# Patient Record
Sex: Male | Born: 2000 | Race: Black or African American | Hispanic: No | Marital: Single | Smoking: Current every day smoker
Health system: Southern US, Community
[De-identification: ages and names within clinical notes are randomized; demographics above are authoritative.]

## PROBLEM LIST (undated history)

## (undated) DIAGNOSIS — R569 Unspecified convulsions: Secondary | ICD-10-CM

---
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
## 2020-01-23 ENCOUNTER — Emergency Department (HOSPITAL_COMMUNITY)
Admission: EM | Admit: 2020-01-23 | Discharge: 2020-01-23 | Disposition: A | Discharge status: Home / Self Care | Payer: Medicaid Other | Attending: Emergency Medicine | Admitting: Emergency Medicine

## 2020-01-23 ENCOUNTER — Emergency Department (HOSPITAL_COMMUNITY): Payer: Medicaid Other

## 2020-01-23 ENCOUNTER — Other Ambulatory Visit: Payer: Self-pay

## 2020-01-23 DIAGNOSIS — R569 Unspecified convulsions: Secondary | ICD-10-CM | POA: Diagnosis present

## 2020-01-23 DIAGNOSIS — Z7722 Contact with and (suspected) exposure to environmental tobacco smoke (acute) (chronic): Secondary | ICD-10-CM | POA: Diagnosis not present

## 2020-01-23 LAB — COMPREHENSIVE METABOLIC PANEL
ALT: 108 U/L — ABNORMAL HIGH (ref 0–44)
AST: 79 U/L — ABNORMAL HIGH (ref 15–41)
Albumin: 4.3 g/dL (ref 3.5–5.0)
Alkaline Phosphatase: 111 U/L (ref 38–126)
Anion gap: 20 — ABNORMAL HIGH (ref 5–15)
BUN: 9 mg/dL (ref 6–20)
CO2: 16 mmol/L — ABNORMAL LOW (ref 22–32)
Calcium: 9.2 mg/dL (ref 8.9–10.3)
Chloride: 105 mmol/L (ref 98–111)
Creatinine, Ser: 1.11 mg/dL (ref 0.61–1.24)
GFR calc Af Amer: >60 mL/min (ref >60)
GFR calc non Af Amer: >60 mL/min (ref >60)
Glucose, Bld: 128 mg/dL — ABNORMAL HIGH (ref 70–99)
Potassium: 4.3 mmol/L (ref 3.5–5.1)
Sodium: 141 mmol/L (ref 135–145)
Total Bilirubin: 0.4 mg/dL (ref 0.3–1.2)
Total Protein: 7.1 g/dL (ref 6.5–8.1)

## 2020-01-23 LAB — URINALYSIS, ROUTINE W REFLEX MICROSCOPIC
Bacteria, UA: NONE SEEN
Bilirubin Urine: NEGATIVE
Glucose, UA: NEGATIVE mg/dL
Ketones, ur: NEGATIVE mg/dL
Leukocytes,Ua: NEGATIVE
Nitrite: NEGATIVE
Protein, ur: NEGATIVE mg/dL
Specific Gravity, Urine: 1.014 (ref 1.005–1.030)
pH: 6 (ref 5.0–8.0)

## 2020-01-23 LAB — CBC WITH DIFFERENTIAL/PLATELET
Abs Immature Granulocytes: 0.02 10*3/uL (ref 0.00–0.07)
Basophils Absolute: 0.1 10*3/uL (ref 0.0–0.1)
Basophils Relative: 1 %
Eosinophils Absolute: 0.4 10*3/uL (ref 0.0–0.5)
Eosinophils Relative: 5 %
HCT: 41.6 % (ref 39.0–52.0)
Hemoglobin: 13.1 g/dL (ref 13.0–17.0)
Immature Granulocytes: 0 %
Lymphocytes Relative: 48 %
Lymphs Abs: 3.7 10*3/uL (ref 0.7–4.0)
MCH: 28.7 pg (ref 26.0–34.0)
MCHC: 31.5 g/dL (ref 30.0–36.0)
MCV: 91.2 fL (ref 80.0–100.0)
Monocytes Absolute: 0.6 10*3/uL (ref 0.1–1.0)
Monocytes Relative: 7 %
Neutro Abs: 3.1 10*3/uL (ref 1.7–7.7)
Neutrophils Relative %: 39 %
Platelets: 209 10*3/uL (ref 150–400)
RBC: 4.56 MIL/uL (ref 4.22–5.81)
RDW: 16.7 % — ABNORMAL HIGH (ref 11.5–15.5)
WBC: 7.8 10*3/uL (ref 4.0–10.5)
nRBC: 0 % (ref 0.0–0.2)

## 2020-01-23 LAB — RAPID URINE DRUG SCREEN, HOSP PERFORMED
Amphetamines: NOT DETECTED
Barbiturates: NOT DETECTED
Benzodiazepines: POSITIVE — AB
Cocaine: NOT DETECTED
Opiates: NOT DETECTED
Tetrahydrocannabinol: POSITIVE — AB

## 2020-01-23 LAB — ETHANOL: Alcohol, Ethyl (B): <10 mg/dL (ref <10)

## 2020-01-23 MED ORDER — LORAZEPAM 2 MG/ML IJ SOLN
INTRAMUSCULAR | Status: AC
  Filled 2020-01-23: qty 1

## 2020-01-23 MED ORDER — LEVETIRACETAM IN NACL 1000 MG/100ML IV SOLN
1000.0000 mg | Freq: Once | INTRAVENOUS | Status: AC
  Administered 2020-01-23: 1000 mg via INTRAVENOUS
  Filled 2020-01-23: qty 100

## 2020-01-23 MED ORDER — ACETAMINOPHEN 500 MG PO TABS
1000.0000 mg | ORAL_TABLET | Freq: Once | ORAL | Status: AC
  Administered 2020-01-23: 1000 mg via ORAL
  Filled 2020-01-23: qty 2

## 2020-01-23 MED ORDER — SODIUM CHLORIDE 0.9 % IV BOLUS
1000.0000 mL | Freq: Once | INTRAVENOUS | Status: AC
  Administered 2020-01-23: 1000 mL via INTRAVENOUS
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
## 2020-01-23 NOTE — Discharge Instructions (Signed)
Follow-up with Dr. Gerilyn Pilgrim for your seizures.  No driving.  Also you are given a follow-up for the [facility] allergy for the allergic reaction you had before

## 2020-01-25 ENCOUNTER — Encounter (HOSPITAL_COMMUNITY): Payer: Self-pay | Admitting: Emergency Medicine

## 2020-01-25 ENCOUNTER — Emergency Department (HOSPITAL_COMMUNITY)
Admission: EM | Admit: 2020-01-25 | Discharge: 2020-01-25 | Disposition: A | Discharge status: Home / Self Care | Payer: Medicaid Other | Attending: Emergency Medicine | Admitting: Emergency Medicine

## 2020-01-25 ENCOUNTER — Other Ambulatory Visit: Payer: Self-pay

## 2020-01-25 DIAGNOSIS — L509 Urticaria, unspecified: Secondary | ICD-10-CM | POA: Insufficient documentation

## 2020-01-25 DIAGNOSIS — Z5321 Procedure and treatment not carried out due to patient leaving prior to being seen by health care provider: Secondary | ICD-10-CM | POA: Diagnosis not present

## 2020-01-25 HISTORY — DX: Unspecified convulsions: R56.9

## 2020-01-25 NOTE — ED Triage Notes (Addendum)
Pt started breaking out in hives around 0015. Pt has been seen for the same and has appt with pcp tomorrow. Pt did take 2 benadryl before coming.

## 2020-03-20 ENCOUNTER — Ambulatory Visit: Payer: Medicaid Other | Admitting: Neurology

## 2020-03-20 ENCOUNTER — Encounter: Payer: Self-pay | Admitting: Neurology

## 2020-05-27 ENCOUNTER — Ambulatory Visit: Payer: Medicaid Other | Admitting: Neurology

## 2020-07-25 ENCOUNTER — Ambulatory Visit: Payer: Medicaid Other | Admitting: Neurology

## 2020-07-25 ENCOUNTER — Encounter: Payer: Self-pay | Admitting: Neurology

## 2021-01-09 ENCOUNTER — Emergency Department (HOSPITAL_COMMUNITY)
Admission: EM | Admit: 2021-01-09 | Discharge: 2021-01-09 | Disposition: A | Discharge status: Home / Self Care | Payer: Medicaid Other | Attending: Emergency Medicine | Admitting: Emergency Medicine

## 2021-01-09 ENCOUNTER — Other Ambulatory Visit: Payer: Self-pay

## 2021-01-09 ENCOUNTER — Encounter (HOSPITAL_COMMUNITY): Payer: Self-pay | Admitting: Emergency Medicine

## 2021-01-09 DIAGNOSIS — R519 Headache, unspecified: Secondary | ICD-10-CM | POA: Diagnosis not present

## 2021-01-09 DIAGNOSIS — R569 Unspecified convulsions: Secondary | ICD-10-CM | POA: Insufficient documentation

## 2021-01-09 DIAGNOSIS — M549 Dorsalgia, unspecified: Secondary | ICD-10-CM | POA: Insufficient documentation

## 2021-01-09 DIAGNOSIS — Z7722 Contact with and (suspected) exposure to environmental tobacco smoke (acute) (chronic): Secondary | ICD-10-CM | POA: Diagnosis not present

## 2021-01-09 LAB — CBC WITH DIFFERENTIAL/PLATELET
Abs Immature Granulocytes: 0.02 10*3/uL (ref 0.00–0.07)
Basophils Absolute: 0.1 10*3/uL (ref 0.0–0.1)
Basophils Relative: 1 %
Eosinophils Absolute: 0.2 10*3/uL (ref 0.0–0.5)
Eosinophils Relative: 4 %
HCT: 42.2 % (ref 39.0–52.0)
Hemoglobin: 13.7 g/dL (ref 13.0–17.0)
Immature Granulocytes: 0 %
Lymphocytes Relative: 23 %
Lymphs Abs: 1.5 10*3/uL (ref 0.7–4.0)
MCH: 28.9 pg (ref 26.0–34.0)
MCHC: 32.5 g/dL (ref 30.0–36.0)
MCV: 89 fL (ref 80.0–100.0)
Monocytes Absolute: 0.6 10*3/uL (ref 0.1–1.0)
Monocytes Relative: 9 %
Neutro Abs: 4.2 10*3/uL (ref 1.7–7.7)
Neutrophils Relative %: 63 %
Platelets: 160 10*3/uL (ref 150–400)
RBC: 4.74 MIL/uL (ref 4.22–5.81)
RDW: 16.3 % — ABNORMAL HIGH (ref 11.5–15.5)
WBC: 6.7 10*3/uL (ref 4.0–10.5)
nRBC: 0 % (ref 0.0–0.2)

## 2021-01-09 LAB — BASIC METABOLIC PANEL
Anion gap: 7 (ref 5–15)
BUN: 7 mg/dL (ref 6–20)
CO2: 26 mmol/L (ref 22–32)
Calcium: 9.1 mg/dL (ref 8.9–10.3)
Chloride: 106 mmol/L (ref 98–111)
Creatinine, Ser: 0.77 mg/dL (ref 0.61–1.24)
GFR, Estimated: >60 mL/min (ref >60)
Glucose, Bld: 92 mg/dL (ref 70–99)
Potassium: 4.3 mmol/L (ref 3.5–5.1)
Sodium: 139 mmol/L (ref 135–145)

## 2021-01-09 MED ORDER — ACETAMINOPHEN 325 MG PO TABS
650.0000 mg | ORAL_TABLET | Freq: Once | ORAL | Status: AC
  Administered 2021-01-09: 650 mg via ORAL
  Filled 2021-01-09: qty 2

## 2021-01-09 MED ORDER — LEVETIRACETAM IN NACL 1000 MG/100ML IV SOLN
1000.0000 mg | Freq: Once | INTRAVENOUS | Status: AC
  Administered 2021-01-09: 1000 mg via INTRAVENOUS
  Filled 2021-01-09: qty 100

## 2021-01-09 MED ORDER — LEVETIRACETAM 500 MG PO TABS: 500.0000 mg | ORAL_TABLET | Freq: Two times a day (BID) | ORAL | 2 refills | Status: DC
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
## 2021-01-09 NOTE — Discharge Instructions (Signed)
You were seen in the emergency department for evaluation after a probable seizure.  You had lab work that was unremarkable.  You were given an IV dose of seizure medication and a prescription for pills to start tomorrow.  You will need to see a neurologist for follow-up.  You should not drive until you are evaluated by neurology.

## 2021-01-09 NOTE — ED Triage Notes (Signed)
Patient presents with EMS from a friend's home, had a seizure in the front yard, about 1.5-2 min long, appeared to be grand mal, fell into a bush, has some abrasions and bit his tongue. Was initially postictal, but is now alert and oriented, ambulated to bed from stretcher.

## 2021-04-01 ENCOUNTER — Emergency Department (HOSPITAL_COMMUNITY): Payer: Medicaid Other

## 2021-04-01 ENCOUNTER — Emergency Department (HOSPITAL_COMMUNITY)
Admission: EM | Admit: 2021-04-01 | Discharge: 2021-04-01 | Disposition: A | Discharge status: Home / Self Care | Payer: Medicaid Other | Attending: Emergency Medicine | Admitting: Emergency Medicine

## 2021-04-01 ENCOUNTER — Other Ambulatory Visit: Payer: Self-pay

## 2021-04-01 ENCOUNTER — Encounter (HOSPITAL_COMMUNITY): Payer: Self-pay | Admitting: *Deleted

## 2021-04-01 DIAGNOSIS — R202 Paresthesia of skin: Secondary | ICD-10-CM | POA: Insufficient documentation

## 2021-04-01 DIAGNOSIS — H53149 Visual discomfort, unspecified: Secondary | ICD-10-CM | POA: Insufficient documentation

## 2021-04-01 DIAGNOSIS — R519 Headache, unspecified: Secondary | ICD-10-CM | POA: Insufficient documentation

## 2021-04-01 DIAGNOSIS — Z7722 Contact with and (suspected) exposure to environmental tobacco smoke (acute) (chronic): Secondary | ICD-10-CM | POA: Diagnosis not present

## 2021-04-01 LAB — BASIC METABOLIC PANEL
Anion gap: 6 (ref 5–15)
BUN: 13 mg/dL (ref 6–20)
CO2: 27 mmol/L (ref 22–32)
Calcium: 9.2 mg/dL (ref 8.9–10.3)
Chloride: 104 mmol/L (ref 98–111)
Creatinine, Ser: 0.94 mg/dL (ref 0.61–1.24)
GFR, Estimated: >60 mL/min (ref >60)
Glucose, Bld: 93 mg/dL (ref 70–99)
Potassium: 3.7 mmol/L (ref 3.5–5.1)
Sodium: 137 mmol/L (ref 135–145)

## 2021-04-01 LAB — CBC WITH DIFFERENTIAL/PLATELET
Abs Immature Granulocytes: 0.02 10*3/uL (ref 0.00–0.07)
Basophils Absolute: 0.1 10*3/uL (ref 0.0–0.1)
Basophils Relative: 1 %
Eosinophils Absolute: 0.6 10*3/uL — ABNORMAL HIGH (ref 0.0–0.5)
Eosinophils Relative: 10 %
HCT: 40.3 % (ref 39.0–52.0)
Hemoglobin: 13.4 g/dL (ref 13.0–17.0)
Immature Granulocytes: 0 %
Lymphocytes Relative: 48 %
Lymphs Abs: 2.8 10*3/uL (ref 0.7–4.0)
MCH: 29.3 pg (ref 26.0–34.0)
MCHC: 33.3 g/dL (ref 30.0–36.0)
MCV: 88.2 fL (ref 80.0–100.0)
Monocytes Absolute: 0.5 10*3/uL (ref 0.1–1.0)
Monocytes Relative: 9 %
Neutro Abs: 1.9 10*3/uL (ref 1.7–7.7)
Neutrophils Relative %: 32 %
Platelets: 173 10*3/uL (ref 150–400)
RBC: 4.57 MIL/uL (ref 4.22–5.81)
RDW: 15.9 % — ABNORMAL HIGH (ref 11.5–15.5)
WBC: 5.8 10*3/uL (ref 4.0–10.5)
nRBC: 0 % (ref 0.0–0.2)

## 2021-04-01 MED ORDER — DIPHENHYDRAMINE HCL 50 MG/ML IJ SOLN
12.5000 mg | Freq: Once | INTRAMUSCULAR | Status: AC
  Administered 2021-04-01: 12.5 mg via INTRAVENOUS
  Filled 2021-04-01: qty 1

## 2021-04-01 MED ORDER — METOCLOPRAMIDE HCL 5 MG/ML IJ SOLN
10.0000 mg | Freq: Once | INTRAMUSCULAR | Status: AC
  Administered 2021-04-01: 10 mg via INTRAVENOUS
  Filled 2021-04-01: qty 2

## 2021-04-01 MED ORDER — KETOROLAC TROMETHAMINE 30 MG/ML IJ SOLN
15.0000 mg | Freq: Once | INTRAMUSCULAR | Status: AC
  Administered 2021-04-01: 15 mg via INTRAVENOUS
  Filled 2021-04-01: qty 1

## 2021-04-01 MED ORDER — SODIUM CHLORIDE 0.9 % IV BOLUS
500.0000 mL | Freq: Once | INTRAVENOUS | Status: AC
  Administered 2021-04-01: 500 mL via INTRAVENOUS

## 2021-04-01 NOTE — ED Triage Notes (Signed)
States he has a headache and had to leave work, needs a  note for work

## 2021-04-01 NOTE — Discharge Instructions (Signed)
Lab work and imaging are all reassuring.  Please continue with home medication as prescribed.  Please stay hydrated, improve on your sleep, decrease stress as this will help the increase frequency of headaches.  Please follow-up your PCP for further evaluation.  Come back to the emergency department if you develop chest pain, shortness of breath, severe abdominal pain, uncontrolled nausea, vomiting, diarrhea.
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
## 2021-12-16 IMAGING — CT CT HEAD W/O CM
3 series · 16 of 47 positions shown, 19 images · non-contrast
Comparison: January 23, 2020.

CLINICAL DATA: Headache, chronic, new features or increased
frequency

EXAM:
CT HEAD WITHOUT CONTRAST
TECHNIQUE: Contiguous axial images were obtained from the base of the skull
through the vertex without intravenous contrast.

[Series 2: head w o · axial · 0.41mm/px · z∈[+22,+152]mm · 10 of 32 slices shown, 13 images]
[im 3/32  brain]
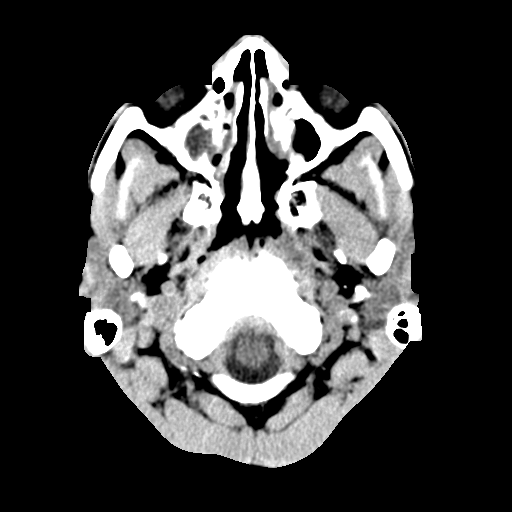
[im 3/32  bone]
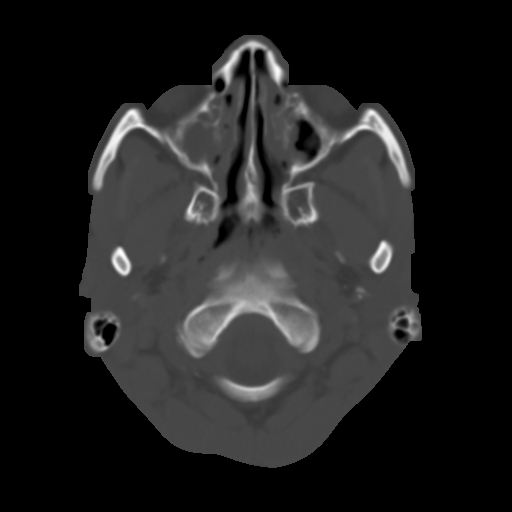
[im 6/32  brain]
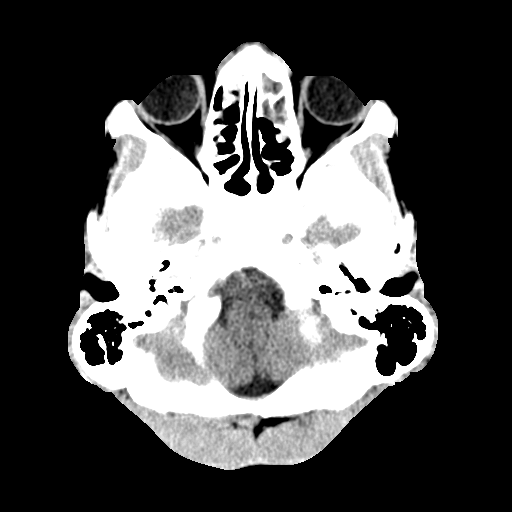
[im 9/32  brain]
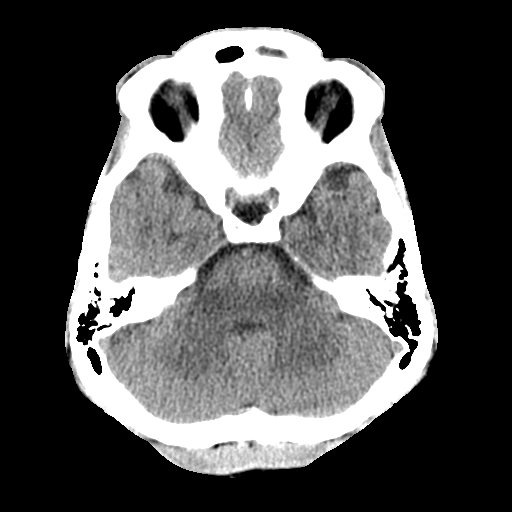
[im 11/32  brain]
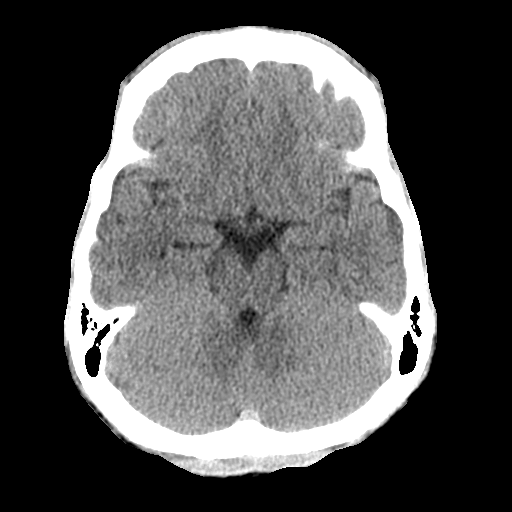
[im 14/32  brain]
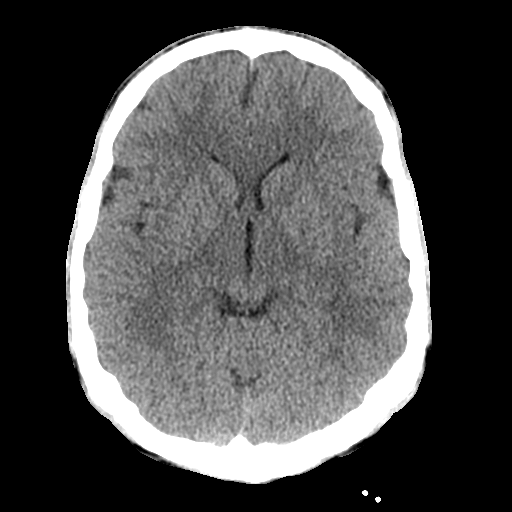
[im 14/32  bone]
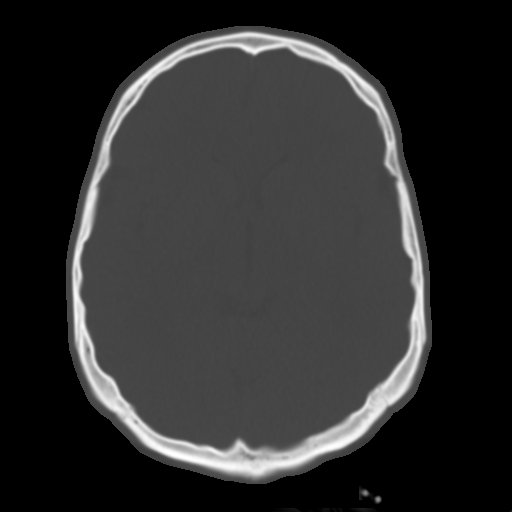
[im 18/32  brain]
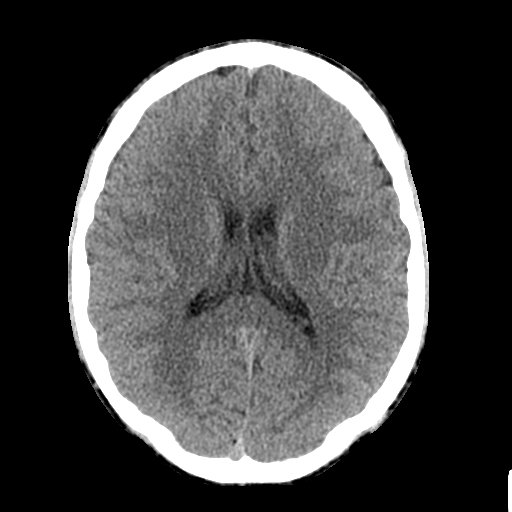
[im 21/32  brain]
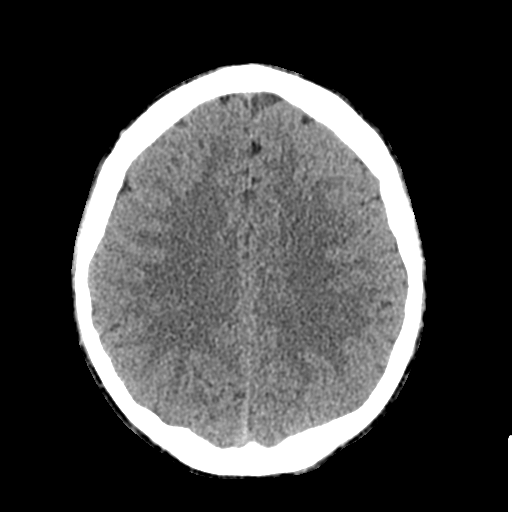
[im 24/32  brain]
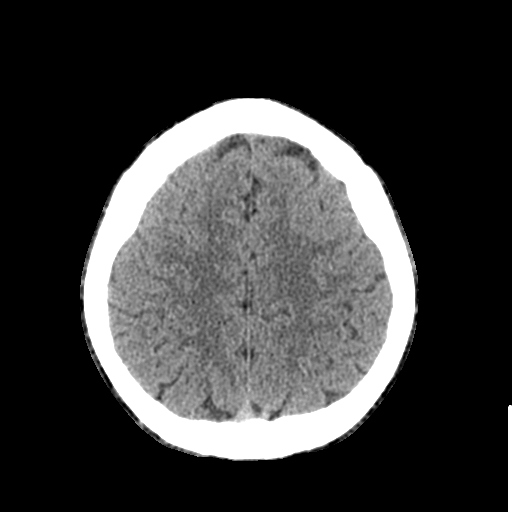
[im 26/32  brain]
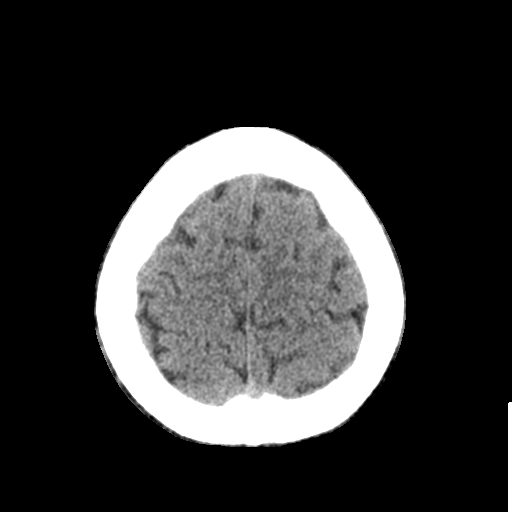
[im 26/32  bone]
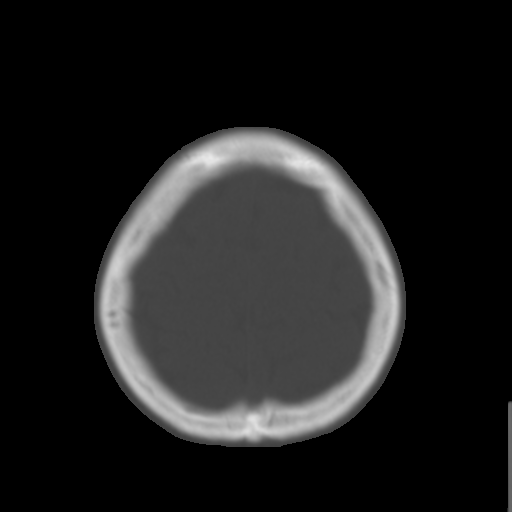
[im 29/32  brain]
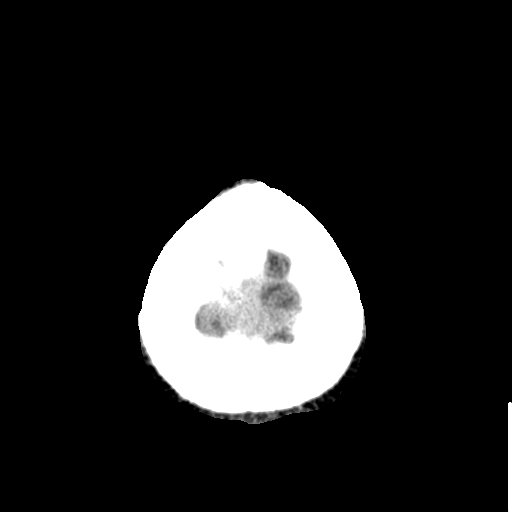

[Series 4: coronal soft · coronal · 0.33mm/px · 3 of 67 slices shown]
[im 23/67  brain]
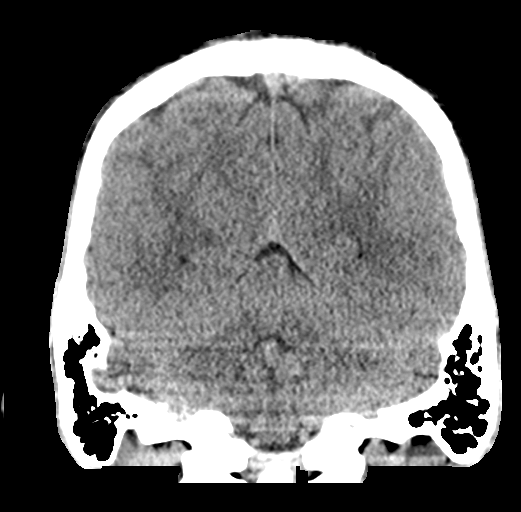
[im 30/67  brain]
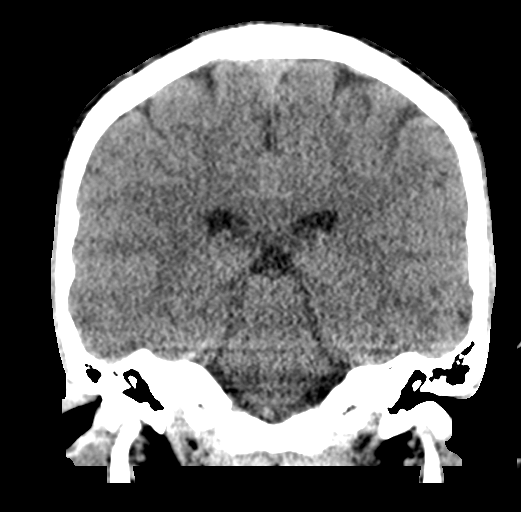
[im 37/67  brain]
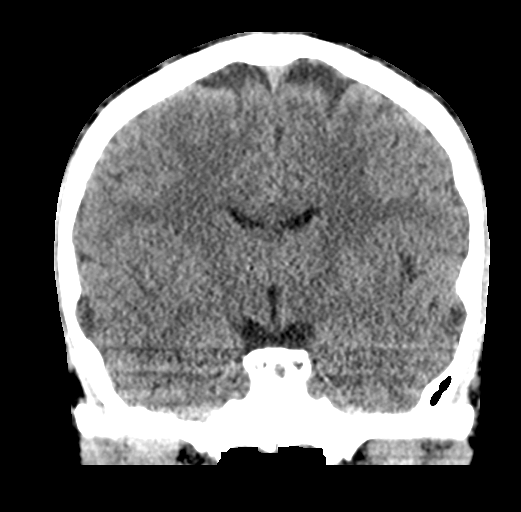

[Series 5: sagittal soft · sagittal · 0.33mm/px · 3 of 56 slices shown]
[im 19/56  brain]
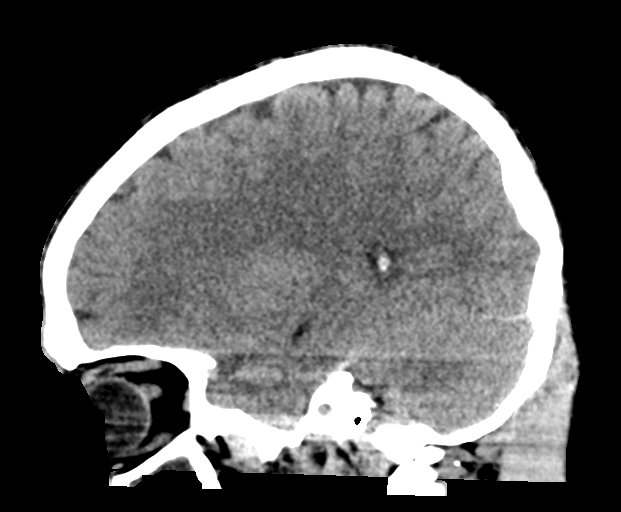
[im 28/56  brain]
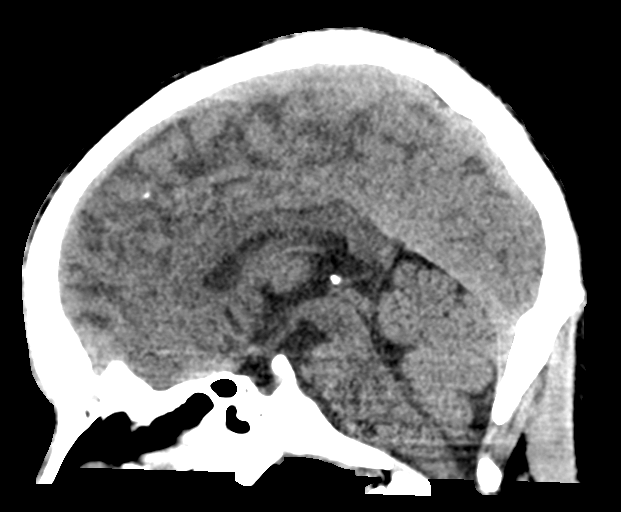
[im 37/56  brain]
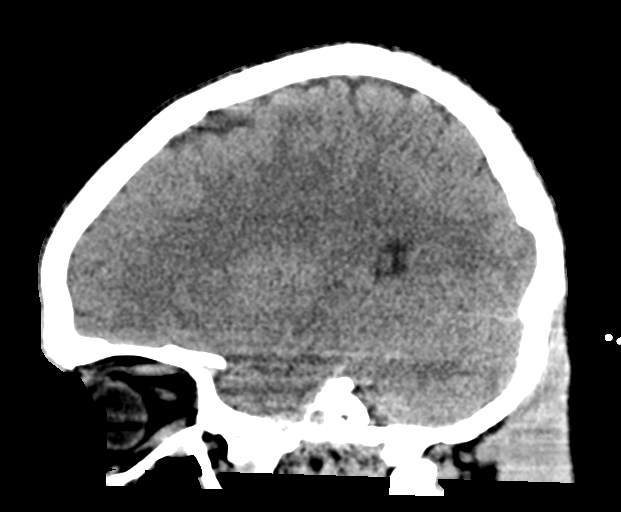

[16 of 47 positions shown; findings below may reference images not displayed]

FINDINGS: Brain: No evidence of acute infarction, hemorrhage, hydrocephalus,
extra-axial collection or mass lesion/mass effect.

Vascular: No hyperdense vessel identified.

Skull: No acute fracture.

Sinuses/Orbits: Opacification of multiple left greater than right
ethmoid air cells. Complete opacification of the left frontal sinus
with mucosal thickening of the right frontal sinus. Complete
opacification of the visualized right maxillary sinus and moderate
mucosal thickening visualized left maxillary sinus with frothy
secretions. Mild mucosal thickening of the visualized sphenoid
sinuses bilaterally. No acute orbital findings.

Other: No mastoid effusions.
IMPRESSION: 1. No evidence of acute intracranial abnormality.
2. Severe paranasal sinus disease, detailed above.

## 2021-12-25 ENCOUNTER — Encounter: Payer: Self-pay | Admitting: Neurology

## 2021-12-25 ENCOUNTER — Ambulatory Visit (INDEPENDENT_AMBULATORY_CARE_PROVIDER_SITE_OTHER): Payer: Medicaid Other | Admitting: Neurology

## 2021-12-25 VITALS — BP 126/78 | HR 76 | Ht 72.0 in | Wt 131.0 lb

## 2021-12-25 DIAGNOSIS — Z5181 Encounter for therapeutic drug level monitoring: Secondary | ICD-10-CM

## 2021-12-25 DIAGNOSIS — G40309 Generalized idiopathic epilepsy and epileptic syndromes, not intractable, without status epilepticus: Secondary | ICD-10-CM

## 2021-12-25 NOTE — Progress Notes (Signed)
[facility]  PATIENT: Ryan Blackwell DOB: [birth year]  REQUESTING CLINICIAN: Massenburg, O'Laf, PA-C HISTORY FROM: Patient and mother  REASON FOR VISIT: Epilepsy, establish care.    HISTORICAL  CHIEF COMPLAINT:  Chief Complaint  Patient presents with   New Patient (Initial Visit)    Room 15, with mother  States last sz was in June and pcp increased keppra to 1000 md bid, states he is doing well on dose.     HISTORY OF PRESENT ILLNESS:  This is a 21 year old gentleman with past medical history of seizure disorder and allergy who is presenting to establish care for his epilepsy.  Patient reports his first seizure was in 2020.  He remembers that day he was sitting down playing video game and suddenly did not feel well but the next and that he remembers is waking up in the hospital.  He was told that he froze, stare, and then fell face forward and started having generalized convulsion.  Since then he has been having seizures on average once every 2 to 76-months associated with lack of sleep or missing his Keppra.  Currently he is on Keppra 1000 mg twice daily, last seizure was in June and this is in setting of missing his Keppra for about 2 days.  Denies any seizure risk factor other than a TBI at the age of 74.   Handedness: Left handed   Onset: 2020  Seizure Type: Unclear, probably generalized   Current frequency: 1 every 2 to 3 months   Any injuries from seizures:   Seizure risk factors: TBI  Previous ASMs: Keppra   Currenty ASMs: Keppra 1000 mg BID   ASMs side effects: Irritability   Brain Images: Normal CT head in September 2022  Previous EEGs: Not available for review   OTHER MEDICAL CONDITIONS: Seizures, allergies   REVIEW OF SYSTEMS: Full 14 system review of systems performed and negative with exception of: As noted in the HPI  ALLERGIES: No Known Allergies  HOME MEDICATIONS: Outpatient Medications Prior to Visit  Medication Sig Dispense  Refill   cetirizine-pseudoephedrine (ZYRTEC-D) 5-120 MG tablet Take 1 tablet by mouth daily. (Patient not taking: Reported on 01/23/2020) 30 tablet 0   diphenhydrAMINE (BENADRYL) 25 MG tablet Take 1 tablet (25 mg total) by mouth every 6 (six) hours as needed. 30 tablet 0   ibuprofen (ADVIL) 800 MG tablet Take 1 tablet (800 mg total) by mouth every 8 (eight) hours as needed. (Patient not taking: Reported on 01/23/2020) 90 tablet 1   levETIRAcetam (KEPPRA) 500 MG tablet Take 1 tablet (500 mg total) by mouth 2 (two) times daily. (Patient taking differently: Take 1,000 mg by mouth 2 (two) times daily.) 60 tablet 2   No facility-administered medications prior to visit.    PAST MEDICAL HISTORY: Past Medical History:  Diagnosis Date   Seizures (HCC)     PAST SURGICAL HISTORY: Past Surgical History:  Procedure Laterality Date   FOREIGN BODY REMOVAL Right 03/31/2019   Procedure: REMOVAL OF BULLET RIGHT LEG;  Surgeon: Vickki Hearing, MD;  Location: [facility];  Service: Orthopedics;  Laterality: Right;    FAMILY HISTORY: History reviewed. No pertinent family history.  SOCIAL HISTORY: Social History   Socioeconomic History   Marital status: Single    Spouse name: Not on file   Number of children: Not on file   Years of education: Not on file   Highest education level: Not on file  Occupational History   Not on file  Tobacco Use   Smoking status: Passive Smoke Exposure - Never Smoker   Smokeless tobacco: Never  Vaping Use   Vaping Use: Never used  Substance and Sexual Activity   Alcohol use: No   Drug use: No   Sexual activity: Not on file  Other Topics Concern   Not on file  Social History Narrative   Not on file   Social Determinants of Health   Financial Resource Strain: Not on file  Food Insecurity: Not on file  Transportation Needs: Not on file  Physical Activity: Not on file  Stress: Not on file  Social Connections: Not on file  Intimate Partner Violence: Not on file     PHYSICAL EXAM  GENERAL EXAM/CONSTITUTIONAL: Vitals:  Vitals:   12/25/21 1404  BP: 126/78  Pulse: 76  Weight: 131 lb (59.4 kg)  Height: 6' (1.829 m)   Body mass index is 17.77 kg/m. Wt Readings from Last 3 Encounters:  12/25/21 131 lb (59.4 kg)  04/01/21 134 lb 11.2 oz (61.1 kg)  01/09/21 134 lb (60.8 kg)   Patient is in no distress; well developed, nourished and groomed; neck is supple  EYES: Pupils round and reactive to light, Visual fields full to confrontation, Extraocular movements intacts,  No results found.  MUSCULOSKELETAL: Gait, strength, tone, movements noted in Neurologic exam below  NEUROLOGIC: MENTAL STATUS:      No data to display         awake, alert, oriented to person, place and time recent and remote memory intact normal attention and concentration language fluent, comprehension intact, naming intact fund of knowledge appropriate  CRANIAL NERVE:  2nd, 3rd, 4th, 6th - pupils equal and reactive to light, visual fields full to confrontation, extraocular muscles intact, no nystagmus 5th - facial sensation symmetric 7th - facial strength symmetric 8th - hearing intact 9th - palate elevates symmetrically, uvula midline 11th - shoulder shrug symmetric 12th - tongue protrusion midline  MOTOR:  normal bulk and tone, full strength in the BUE, BLE  SENSORY:  normal and symmetric to light touch, pinprick, temperature, vibration  COORDINATION:  finger-nose-finger, fine finger movements normal  REFLEXES:  deep tendon reflexes present and symmetric  GAIT/STATION:  normal   DIAGNOSTIC DATA (LABS, IMAGING, TESTING) - I reviewed patient records, labs, notes, testing and imaging myself where available.  Lab Results  Component Value Date   WBC 5.8 04/01/2021   HGB 13.4 04/01/2021   HCT 40.3 04/01/2021   MCV 88.2 04/01/2021   PLT 173 04/01/2021      Component Value Date/Time   NA 137 04/01/2021 1535   K 3.7 04/01/2021 1535   CL 104  04/01/2021 1535   CO2 27 04/01/2021 1535   GLUCOSE 93 04/01/2021 1535   BUN 13 04/01/2021 1535   CREATININE 0.94 04/01/2021 1535   CALCIUM 9.2 04/01/2021 1535   PROT 7.1 01/23/2020 1156   ALBUMIN 4.3 01/23/2020 1156   AST 79 (H) 01/23/2020 1156   ALT 108 (H) 01/23/2020 1156   ALKPHOS 111 01/23/2020 1156   BILITOT 0.4 01/23/2020 1156   GFRNONAA >60 04/01/2021 1535   GFRAA >60 01/23/2020 1156   No results found for: "CHOL", "HDL", "LDLCALC", "LDLDIRECT", "TRIG" No results found for: "HGBA1C" No results found for: "VITAMINB12" No results found for: "TSH"  Head CT 03/2021 1. No evidence of acute intracranial abnormality. 2. Severe paranasal sinus disease, detailed above   ASSESSMENT AND PLAN  21 y.o. year old male  with history of epilepsy and allergy  who is presenting to establish care.  Patient first seizure was in 2020 and since then he has breakthrough seizure in the setting of missing his Keppra dose or lack of sleep.  His last seizure was in the beginning of this month since then his Keppra has been increased to 1000 mg twice daily.  I will start by obtaining a Keppra level today as baseline, order a brain MRI with and without contrast and routine EEG.  We also discussed driving restriction for the next 6 months, he is comfortable with plans.  I will see him in 3 months for follow-up or sooner if worse   1. Generalized idiopathic epilepsy and epileptic syndromes, not intractable, without status epilepticus (HCC)   2. Therapeutic drug monitoring     Patient Instructions  Continue with Keppra 1000 mg twice daily, Check a Keppra level today Routine EEG MRI brain with and without contrast Follow-up in 3 months or sooner if worse   Per [facility] statutes, patients with seizures are not allowed to drive until they have been seizure-free for six months.  Other recommendations include using caution when using heavy equipment or power tools. Avoid working on ladders or at  heights. Take showers instead of baths.  Do not swim alone.  Ensure the water temperature is not too high on the home water heater. Do not go swimming alone. Do not lock yourself in a room alone (i.e. bathroom). When caring for infants or small children, sit down when holding, feeding, or changing them to minimize risk of injury to the child in the event you have a seizure. Maintain good sleep hygiene. Avoid alcohol.  Also recommend adequate sleep, hydration, good diet and minimize stress.   During the Seizure  - First, ensure adequate ventilation and place patients on the floor on their left side  Loosen clothing around the neck and ensure the airway is patent. If the patient is clenching the teeth, do not force the mouth open with any object as this can cause severe damage - Remove all items from the surrounding that can be hazardous. The patient may be oblivious to what's happening and may not even know what he or she is doing. If the patient is confused and wandering, either gently guide him/her away and block access to outside areas - Reassure the individual and be comforting - Call 911. In most cases, the seizure ends before EMS arrives. However, there are cases when seizures may last over 3 to 5 minutes. Or the individual may have developed breathing difficulties or severe injuries. If a pregnant patient or a person with diabetes develops a seizure, it is prudent to call an ambulance. - Finally, if the patient does not regain full consciousness, then call EMS. Most patients will remain confused for about 45 to 90 minutes after a seizure, so you must use judgment in calling for help. - Avoid restraints but make sure the patient is in a bed with padded side rails - Place the individual in a lateral position with the neck slightly flexed; this will help the saliva drain from the mouth and prevent the tongue from falling backward - Remove all nearby furniture and other hazards from the area -  Provide verbal assurance as the individual is regaining consciousness - Provide the patient with privacy if possible - Call for help and start treatment as ordered by the caregiver   After the Seizure (Postictal Stage)  After a seizure, most patients experience confusion, fatigue, muscle pain  and/or a headache. Thus, one should permit the individual to sleep. For the next few days, reassurance is essential. Being calm and helping reorient the person is also of importance.  Most seizures are painless and end spontaneously. Seizures are not harmful to others but can lead to complications such as stress on the lungs, brain and the heart. Individuals with prior lung problems may develop labored breathing and respiratory distress.     Orders Placed This Encounter  Procedures   MR BRAIN W WO CONTRAST   Levetiracetam level   EEG adult    No orders of the defined types were placed in this encounter.   Return in about 3 months (around 03/27/2022).    Windell Norfolk, MD 12/25/2021, 9:04 PM  [facility] [address] [place], [place] [address] [phone]

## 2021-12-25 NOTE — Patient Instructions (Addendum)
Continue with Keppra 1000 mg twice daily, Check a Keppra level today Routine EEG MRI brain with and without contrast Follow-up in 3 months or sooner if worse

## 2021-12-26 ENCOUNTER — Telehealth: Payer: Self-pay | Admitting: Neurology

## 2021-12-26 LAB — LEVETIRACETAM LEVEL: Levetiracetam Lvl: 30.6 ug/mL (ref 10.0–40.0)

## 2021-12-26 NOTE — Telephone Encounter (Signed)
medicaid healthy blue Berkley Harvey: [id number] exp. 12/26/21-02/23/22 sent to GI

## 2021-12-30 ENCOUNTER — Encounter: Payer: Self-pay | Admitting: *Deleted

## 2021-12-30 ENCOUNTER — Other Ambulatory Visit: Payer: Medicaid Other | Admitting: *Deleted

## 2021-12-31 ENCOUNTER — Telehealth: Payer: Self-pay | Admitting: *Deleted

## 2021-12-31 NOTE — Progress Notes (Signed)
Please call and advise the patient that the recent Keppra level was within normal limits. No further action is required on these tests at this time. Please remind patient to keep any upcoming appointments or tests and to call us with any interim questions, concerns, problems or updates. Thanks,   Khushbu Pippen, MD  

## 2021-12-31 NOTE — Telephone Encounter (Signed)
-----   Message from Windell Norfolk, MD sent at 12/31/2021  9:23 AM EDT ----- Please call and advise the patient that the recent Keppra level was within normal limits. No further action is required on these tests at this time. Please remind patient to keep any upcoming appointments or tests and to call us with any interim questions, concerns, problems or updates. Thanks,   Windell Norfolk, MD

## 2022-01-29 ENCOUNTER — Other Ambulatory Visit: Payer: Medicaid Other | Admitting: *Deleted

## 2022-01-29 ENCOUNTER — Telehealth: Payer: Self-pay | Admitting: Neurology

## 2022-01-29 NOTE — Telephone Encounter (Signed)
Pt's mother cancelled pt's appt due to transportation issues

## 2022-02-09 ENCOUNTER — Telehealth: Payer: Self-pay | Admitting: Neurology

## 2022-02-09 NOTE — Telephone Encounter (Signed)
LVM informing pt of need to reschedule EEG due to equipment issues

## 2022-02-10 ENCOUNTER — Other Ambulatory Visit: Payer: Medicaid Other | Admitting: *Deleted

## 2022-03-04 ENCOUNTER — Encounter: Payer: Self-pay | Admitting: Neurology

## 2022-03-12 ENCOUNTER — Other Ambulatory Visit: Payer: Medicaid Other | Admitting: *Deleted

## 2022-04-07 ENCOUNTER — Ambulatory Visit: Payer: Medicaid Other | Admitting: Neurology

## 2022-04-30 ENCOUNTER — Ambulatory Visit: Payer: Medicaid Other | Admitting: Neurology

## 2022-04-30 ENCOUNTER — Encounter: Payer: Self-pay | Admitting: Neurology

## 2022-07-27 ENCOUNTER — Emergency Department (HOSPITAL_COMMUNITY)
Admission: EM | Admit: 2022-07-27 | Discharge: 2022-07-27 | Disposition: A | Discharge status: Home / Self Care | Payer: Medicaid Other | Attending: Emergency Medicine | Admitting: Emergency Medicine

## 2022-07-27 ENCOUNTER — Encounter (HOSPITAL_COMMUNITY): Payer: Self-pay | Admitting: Emergency Medicine

## 2022-07-27 DIAGNOSIS — R569 Unspecified convulsions: Secondary | ICD-10-CM | POA: Insufficient documentation

## 2022-07-27 LAB — CBC WITH DIFFERENTIAL/PLATELET
Abs Immature Granulocytes: 0.02 10*3/uL (ref 0.00–0.07)
Basophils Absolute: 0.1 10*3/uL (ref 0.0–0.1)
Basophils Relative: 1 %
Eosinophils Absolute: 0.3 10*3/uL (ref 0.0–0.5)
Eosinophils Relative: 4 %
HCT: 43.5 % (ref 39.0–52.0)
Hemoglobin: 13.7 g/dL (ref 13.0–17.0)
Immature Granulocytes: 0 %
Lymphocytes Relative: 45 %
Lymphs Abs: 3.8 10*3/uL (ref 0.7–4.0)
MCH: 29.1 pg (ref 26.0–34.0)
MCHC: 31.5 g/dL (ref 30.0–36.0)
MCV: 92.4 fL (ref 80.0–100.0)
Monocytes Absolute: 0.9 10*3/uL (ref 0.1–1.0)
Monocytes Relative: 10 %
Neutro Abs: 3.4 10*3/uL (ref 1.7–7.7)
Neutrophils Relative %: 40 %
Platelets: 210 10*3/uL (ref 150–400)
RBC: 4.71 MIL/uL (ref 4.22–5.81)
RDW: 16.3 % — ABNORMAL HIGH (ref 11.5–15.5)
WBC: 8.4 10*3/uL (ref 4.0–10.5)
nRBC: 0 % (ref 0.0–0.2)

## 2022-07-27 LAB — COMPREHENSIVE METABOLIC PANEL
ALT: 36 U/L (ref 0–44)
AST: 29 U/L (ref 15–41)
Albumin: 4.3 g/dL (ref 3.5–5.0)
Alkaline Phosphatase: 114 U/L (ref 38–126)
Anion gap: 14 (ref 5–15)
BUN: 13 mg/dL (ref 6–20)
CO2: 17 mmol/L — ABNORMAL LOW (ref 22–32)
Calcium: 9.2 mg/dL (ref 8.9–10.3)
Chloride: 105 mmol/L (ref 98–111)
Creatinine, Ser: 1.05 mg/dL (ref 0.61–1.24)
GFR, Estimated: >60 mL/min (ref >60)
Glucose, Bld: 95 mg/dL (ref 70–99)
Potassium: 3.9 mmol/L (ref 3.5–5.1)
Sodium: 136 mmol/L (ref 135–145)
Total Bilirubin: 0.4 mg/dL (ref 0.3–1.2)
Total Protein: 7.7 g/dL (ref 6.5–8.1)

## 2022-07-27 LAB — MAGNESIUM: Magnesium: 2.2 mg/dL (ref 1.7–2.4)

## 2022-07-27 MED ORDER — SODIUM CHLORIDE 0.9 % IV BOLUS
1000.0000 mL | Freq: Once | INTRAVENOUS | Status: AC
  Administered 2022-07-27: 1000 mL via INTRAVENOUS

## 2022-07-27 MED ORDER — LEVETIRACETAM IN NACL 1500 MG/100ML IV SOLN
1500.0000 mg | Freq: Once | INTRAVENOUS | Status: AC
  Administered 2022-07-27: 1500 mg via INTRAVENOUS
  Filled 2022-07-27: qty 100

## 2022-07-27 MED ORDER — ACETAMINOPHEN 325 MG PO TABS
650.0000 mg | ORAL_TABLET | Freq: Once | ORAL | Status: AC
  Administered 2022-07-27: 650 mg via ORAL
  Filled 2022-07-27: qty 2

## 2022-07-27 NOTE — Discharge Instructions (Signed)
-  According to [place] law, you can not drive unless you are seizure / syncope free for at least 6 months and under physician's care.  °  °- Please maintain precautions. Do not participate in activities where a loss of awareness could harm you or someone else. No swimming alone, no tub bathing, no hot tubs, no driving, no operating motorized vehicles (cars, ATVs, motocycles, etc), lawnmowers, power tools or firearms. No standing at heights, such as rooftops, ladders or stairs. Avoid hot objects such as stoves, heaters, open fires. Wear a helmet when riding a bicycle, scooter, skateboard, etc. and avoid areas of traffic. Set your water heater to 120 degrees or less.  °

## 2022-07-27 NOTE — ED Notes (Signed)
Seizure pads placed on side of bed rails

## 2022-07-27 NOTE — ED Triage Notes (Signed)
Pt arrived via [facility] c/o seizures activity, Hx of seizures and takes medication for it, been in jail for several months and unable to recall if he took itn during that time, postictal
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
## 2023-01-02 ENCOUNTER — Encounter (HOSPITAL_COMMUNITY): Payer: Self-pay | Admitting: Emergency Medicine

## 2023-01-02 ENCOUNTER — Emergency Department (HOSPITAL_COMMUNITY)
Admission: EM | Admit: 2023-01-02 | Discharge: 2023-01-02 | Disposition: A | Discharge status: Home / Self Care | Payer: Self-pay | Attending: Emergency Medicine | Admitting: Emergency Medicine

## 2023-01-02 ENCOUNTER — Other Ambulatory Visit: Payer: Self-pay

## 2023-01-02 DIAGNOSIS — W57XXXA Bitten or stung by nonvenomous insect and other nonvenomous arthropods, initial encounter: Secondary | ICD-10-CM | POA: Insufficient documentation

## 2023-01-02 DIAGNOSIS — S00462A Insect bite (nonvenomous) of left ear, initial encounter: Secondary | ICD-10-CM

## 2023-01-02 DIAGNOSIS — T161XXA Foreign body in right ear, initial encounter: Secondary | ICD-10-CM

## 2023-01-02 MED ORDER — LIDOCAINE HCL (PF) 2 % IJ SOLN: 10.0000 mL | Freq: Once | INTRAMUSCULAR | Status: DC

## 2023-01-02 MED ORDER — LIDOCAINE HCL (PF) 2 % IJ SOLN
INTRAMUSCULAR | Status: AC
  Filled 2023-01-02: qty 5

## 2023-01-02 MED ORDER — LIDOCAINE HCL (PF) 2 % IJ SOLN
20.0000 mL | Freq: Once | INTRAMUSCULAR | Status: AC
  Administered 2023-01-02: 20 mL

## 2023-01-02 MED ORDER — CIPROFLOXACIN-DEXAMETHASONE 0.3-0.1 % OT SUSP: 4.0000 [drp] | Freq: Two times a day (BID) | OTIC | 0 refills | Status: AC

## 2023-01-02 NOTE — Discharge Instructions (Signed)
You were seen for the insect in your ear in the emergency department.   At home, please use the ear drops we have given you to prevent any infection.    Check your MyChart online for the results of any tests that had not resulted by the time you left the emergency department.   If you are still having ear pain in 1 to 2 weeks please follow-up with ear nose and throat doctors  Return immediately to the emergency department if you experience any of the following: Severe pain, hearing loss, fevers, or any other concerning symptoms.    Thank you for visiting our Emergency Department. It was a pleasure taking care of you today.

## 2023-01-02 NOTE — ED Triage Notes (Addendum)
Presents from home for insect in Rt ear. Tried pouring alcohol into ear canal and eventually felt the bug stop moving. Has not been able to remove the insect himself. 5/10 pain, muffled hearing on R side. Foreign body noted in ear in triage, skin excoriation also noted on external ear (pt states he scratched himself while insect was alive d/t anxiety)

## 2023-01-02 NOTE — ED Notes (Signed)
ED Provider at bedside. 

## 2023-01-02 NOTE — ED Provider Notes (Signed)
[place] EMERGENCY DEPARTMENT AT [facility] Provider Note   CSN: [id number] Arrival date & time: 01/02/23  2209     History  Chief Complaint  Patient presents with   Foreign Body in Ear    Ryan Blackwell is a 22 y.o. male.  22 year old male previously healthy presents emergency department with insect in the right ear.  Reports that just prior to arrival an insect flew in his ear.  He tried drowning it with alcohol and feels like it is not moving anymore.  Does still have hearing out of his right ear but states that is muffled.  No significant pain if the above is not moving.       Home Medications Prior to Admission medications   Medication Sig Start Date End Date Taking? Authorizing Provider  ciprofloxacin-dexamethasone (CIPRODEX) OTIC suspension Place 4 drops into the right ear 2 (two) times daily for 5 days. 01/02/23 01/07/23 Yes Rondel Baton, MD  cetirizine-pseudoephedrine (ZYRTEC-D) 5-120 MG tablet Take 1 tablet by mouth daily. Patient not taking: Reported on 01/23/2020 11/23/19   Mesner, Barbara Cower, MD  diphenhydrAMINE (BENADRYL) 25 MG tablet Take 1 tablet (25 mg total) by mouth every 6 (six) hours as needed. 11/23/19   Mesner, Barbara Cower, MD  ibuprofen (ADVIL) 800 MG tablet Take 1 tablet (800 mg total) by mouth every 8 (eight) hours as needed. Patient not taking: Reported on 01/23/2020 04/21/19   Vickki Hearing, MD  levETIRAcetam (KEPPRA) 500 MG tablet Take 1 tablet (500 mg total) by mouth 2 (two) times daily. Patient taking differently: Take 1,000 mg by mouth 2 (two) times daily. 01/09/21   Terrilee Files, MD      Allergies    Patient has no known allergies.    Review of Systems   Review of Systems  Physical Exam Updated Vital Signs BP 132/61 (BP Location: Right Arm)   Pulse 95   Temp 100.1 F (37.8 C) (Oral)   Resp 14   Wt 60 kg   SpO2 97%   BMI 17.94 kg/m  Physical Exam Constitutional:      Appearance: Normal appearance.  HENT:     Head:  Normocephalic and atraumatic.     Right Ear: External ear normal.     Left Ear: External ear normal.     Ears:     Comments: Insect in right ear does not appear to be moving    Nose: Nose normal.     Mouth/Throat:     Mouth: Mucous membranes are moist.     Pharynx: Oropharynx is clear.  Neurological:     Mental Status: He is alert.     ED Results / Procedures / Treatments   Labs (all labs ordered are listed, but only abnormal results are displayed) Labs Reviewed - No data to display  EKG None  Radiology No results found.  Procedures .Foreign Body Removal  Date/Time: 01/02/2023 11:29 PM  Performed by: Rondel Baton, MD Authorized by: Rondel Baton, MD  Body area: ear Location details: right ear  Anesthesia: Local Anesthetic: lidocaine 2% without epinephrine Anesthetic total: 3 mL Removal mechanism: alligator forceps Complexity: complex 1 objects recovered. Objects recovered: Insect Post-procedure assessment: foreign body removed Patient tolerance: patient tolerated the procedure well with no immediate complications      Medications Ordered in ED Medications  lidocaine HCl (PF) (XYLOCAINE) 2 % injection 20 mL (20 mLs Other Given by Other 01/02/23 2248)    ED Course/ Medical Decision Making/ A&P  Medical Decision Making Risk Prescription drug management.   Ryan Blackwell is a 22 y.o. male who presents emergency department insect in the right ear  Initial Ddx:  Insect, abrasions to the ear canal, TM perforation  MDM/Course:  On arrival patient had insect in his right ear.  Did attempt to remove the insect but appeared to be moving.  We did irrigate the ear out with lidocaine and this appeared to have kill the insect.  Was easily removed with alligator forceps afterwards.  Upon inspection did not have any retained insect parts in the ear and his TM appeared intact.  Did have some abrasions to the ear canal also was  given a prescription of Ciprodex to take to prevent any sort of infection and instructed to follow-up with ENT if symptoms not resolved in 1 to 2 weeks.    This patient presents to the ED for concern of complaints listed in HPI, this involves an extensive number of treatment options, and is a complaint that carries with it a high risk of complications and morbidity. Disposition including potential need for admission considered.   Dispo: DC Home. Return precautions discussed including, but not limited to, those listed in the AVS. Allowed pt time to ask questions which were answered fully prior to dc.  Additional history obtained from significant other Records reviewed Outpatient Clinic Notes I have reviewed the patients home medications and made adjustments as needed        Final Clinical Impression(s) / ED Diagnoses Final diagnoses:  Foreign body of right ear, initial encounter  Infected insect bite of ear, left, initial encounter    Rx / DC Orders ED Discharge Orders          Ordered    ciprofloxacin-dexamethasone (CIPRODEX) OTIC suspension  2 times daily        01/02/23 2318              Rondel Baton, MD 01/02/23 [phone]

## 2024-03-04 ENCOUNTER — Emergency Department (HOSPITAL_COMMUNITY): Payer: Self-pay

## 2024-03-04 ENCOUNTER — Encounter (HOSPITAL_COMMUNITY): Payer: Self-pay

## 2024-03-04 ENCOUNTER — Emergency Department (HOSPITAL_COMMUNITY)
Admission: EM | Admit: 2024-03-04 | Discharge: 2024-03-04 | Disposition: A | Discharge status: Home / Self Care | Payer: Self-pay

## 2024-03-04 ENCOUNTER — Other Ambulatory Visit: Payer: Self-pay

## 2024-03-04 DIAGNOSIS — G5601 Carpal tunnel syndrome, right upper limb: Secondary | ICD-10-CM

## 2024-03-04 DIAGNOSIS — R202 Paresthesia of skin: Secondary | ICD-10-CM | POA: Insufficient documentation

## 2024-03-04 DIAGNOSIS — M25531 Pain in right wrist: Secondary | ICD-10-CM | POA: Insufficient documentation

## 2024-03-04 MED ORDER — NAPROXEN 500 MG PO TABS: 500.0000 mg | ORAL_TABLET | Freq: Two times a day (BID) | ORAL | 0 refills | Status: AC

## 2024-03-04 MED ORDER — NAPROXEN 250 MG PO TABS
500.0000 mg | ORAL_TABLET | Freq: Once | ORAL | Status: AC
  Administered 2024-03-04: 500 mg via ORAL
  Filled 2024-03-04: qty 2

## 2024-03-04 NOTE — Discharge Instructions (Addendum)
 Please follow-up closely with orthopedics on an outpatient basis.  Please continue to wear the brace as directed at the bedside.  Return to emergency department immediately for any new or worsening symptoms.

## 2024-03-04 NOTE — ED Provider Notes (Signed)
 [place] EMERGENCY DEPARTMENT AT [facility] Provider Note   CSN: [id number] Arrival date & time: 03/04/24  1008     Patient presents with: Hand Problem   Ryan Blackwell is a 23 y.o. male.   Patient is a 23 year old male who presents emergency department chief complaint of pain to the right wrist, first 3 digits of the right hand with intermittent radiation up the right arm which has been ongoing for approximate the past month which has been gradually becoming worse over the past week.  He notes he did recently start a new job with repetitive movements.  He notes he has had no recent falls or blunt trauma.  He denies any neck pain or back pain.  He does note that he gets intermittent paresthesias in the affected area.        Prior to Admission medications   Medication Sig Start Date End Date Taking? Authorizing Provider  cetirizine -pseudoephedrine  (ZYRTEC -D) 5-120 MG tablet Take 1 tablet by mouth daily. Patient not taking: Reported on 01/23/2020 11/23/19   Mesner, Selinda, MD  diphenhydrAMINE  (BENADRYL ) 25 MG tablet Take 1 tablet (25 mg total) by mouth every 6 (six) hours as needed. 11/23/19   Mesner, Selinda, MD  ibuprofen  (ADVIL ) 800 MG tablet Take 1 tablet (800 mg total) by mouth every 8 (eight) hours as needed. Patient not taking: Reported on 01/23/2020 04/21/19   Margrette Taft BRAVO, MD  levETIRAcetam  (KEPPRA ) 500 MG tablet Take 1 tablet (500 mg total) by mouth 2 (two) times daily. Patient taking differently: Take 1,000 mg by mouth 2 (two) times daily. 01/09/21   Towana Ozell BROCKS, MD    Allergies: Patient has no known allergies.    Review of Systems  Musculoskeletal:        Pain to right hand and wrist  All other systems reviewed and are negative.   Updated Vital Signs BP 131/71 (BP Location: Left Arm)   Pulse (!) 57   Temp 97.9 F (36.6 C) (Oral)   Resp 16   Ht 6' (1.829 m)   Wt 60 kg   SpO2 100%   BMI 17.94 kg/m   Physical Exam Vitals and nursing note  reviewed.  Constitutional:      Appearance: Normal appearance.  HENT:     Head: Normocephalic and atraumatic.     Nose: Nose normal.     Mouth/Throat:     Mouth: Mucous membranes are moist.  Eyes:     Extraocular Movements: Extraocular movements intact.     Conjunctiva/sclera: Conjunctivae normal.     Pupils: Pupils are equal, round, and reactive to light.  Cardiovascular:     Rate and Rhythm: Normal rate and regular rhythm.     Pulses: Normal pulses.     Heart sounds: Normal heart sounds.  Pulmonary:     Effort: Pulmonary effort is normal.     Breath sounds: Normal breath sounds.  Abdominal:     General: Abdomen is flat. Bowel sounds are normal.     Palpations: Abdomen is soft.  Musculoskeletal:        General: Normal range of motion.     Cervical back: Normal range of motion and neck supple.     Comments: Tenderness palpation noted over the dorsal aspect of the right wrist with positive Tinel and Phalen sign, cap refill less than 2 seconds distally, radial pulse 2+ upper extremities, nontender palpation of the right elbow, shoulder, no obvious deformity or bruising, no skin breakdown or ulceration, no  lacerations or abrasions, full range of motion noted throughout  Skin:    General: Skin is warm and dry.  Neurological:     General: No focal deficit present.     Mental Status: He is alert and oriented to person, place, and time. Mental status is at baseline.  Psychiatric:        Mood and Affect: Mood normal.        Behavior: Behavior normal.        Thought Content: Thought content normal.        Judgment: Judgment normal.     (all labs ordered are listed, but only abnormal results are displayed) Labs Reviewed - No data to display  EKG: None  Radiology: No results found.   Procedures   Medications Ordered in the ED  naproxen  (NAPROSYN ) tablet 500 mg (500 mg Oral Given 03/04/24 1038)                                    Medical Decision Making Patient is doing  well at this time and is stable for discharge home.  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  Will apply a Velcro wrist splint as well as NSAIDs to help with his symptoms.  Do suspect carpal tunnel syndrome at this point.  Do not suspect an emergent MRI is warranted.  He has no clinical indication for septic joint or gout.  Do not suspect a central cause of his symptoms at this point.  Did recommend the need for close follow-up with orthopedics on an outpatient basis.  Strict turn precautions were discussed for any new or worsening symptoms.  Patient is otherwise neurovascularly intact distally.  Patient voiced understanding and had no additional questions.  Amount and/or Complexity of Data Reviewed Radiology: ordered.  Risk Prescription drug management.        Final diagnoses:  None    ED Discharge Orders     None          Daralene Lonni JONETTA DEVONNA 03/04/24 1139    Ryan Lavonia SAILOR, MD 03/04/24 1259

## 2024-03-04 NOTE — ED Triage Notes (Signed)
 Pt c/o rt hand tinglings and numbness off and on for over a week. Pt denies injury to hand. Pt denies hx of carpal tunnel, does states neck issues. Pt denies hx of gout.

## 2024-03-09 ENCOUNTER — Emergency Department (HOSPITAL_COMMUNITY): Admission: EM | Admit: 2024-03-09 | Discharge: 2024-03-09 | Discharge status: Against Medical Advice | Payer: Self-pay

## 2024-03-21 ENCOUNTER — Emergency Department (HOSPITAL_COMMUNITY)
Admission: EM | Admit: 2024-03-21 | Discharge: 2024-03-21 | Disposition: A | Discharge status: Home / Self Care | Payer: Self-pay | Attending: Emergency Medicine | Admitting: Emergency Medicine

## 2024-03-21 ENCOUNTER — Other Ambulatory Visit: Payer: Self-pay

## 2024-03-21 ENCOUNTER — Encounter (HOSPITAL_COMMUNITY): Payer: Self-pay

## 2024-03-21 DIAGNOSIS — R569 Unspecified convulsions: Secondary | ICD-10-CM | POA: Insufficient documentation

## 2024-03-21 DIAGNOSIS — X58XXXA Exposure to other specified factors, initial encounter: Secondary | ICD-10-CM | POA: Insufficient documentation

## 2024-03-21 DIAGNOSIS — S01552A Open bite of oral cavity, initial encounter: Secondary | ICD-10-CM | POA: Insufficient documentation

## 2024-03-21 LAB — CBC
HCT: 40.2 % (ref 39.0–52.0)
Hemoglobin: 12.9 g/dL — ABNORMAL LOW (ref 13.0–17.0)
MCH: 29.3 pg (ref 26.0–34.0)
MCHC: 32.1 g/dL (ref 30.0–36.0)
MCV: 91.4 fL (ref 80.0–100.0)
Platelets: 180 K/uL (ref 150–400)
RBC: 4.4 MIL/uL (ref 4.22–5.81)
RDW: 15.1 % (ref 11.5–15.5)
WBC: 5.9 K/uL (ref 4.0–10.5)
nRBC: 0 % (ref 0.0–0.2)

## 2024-03-21 LAB — BASIC METABOLIC PANEL WITH GFR
Anion gap: 15 (ref 5–15)
BUN: 11 mg/dL (ref 6–20)
CO2: 15 mmol/L — ABNORMAL LOW (ref 22–32)
Calcium: 9.3 mg/dL (ref 8.9–10.3)
Chloride: 108 mmol/L (ref 98–111)
Creatinine, Ser: 1.08 mg/dL (ref 0.61–1.24)
GFR, Estimated: >60 mL/min (ref >60)
Glucose, Bld: 115 mg/dL — ABNORMAL HIGH (ref 70–99)
Potassium: 4.3 mmol/L (ref 3.5–5.1)
Sodium: 138 mmol/L (ref 135–145)

## 2024-03-21 LAB — CBG MONITORING, ED: Glucose-Capillary: 129 mg/dL — ABNORMAL HIGH (ref 70–99)

## 2024-03-21 MED ORDER — LEVETIRACETAM (KEPPRA) 500 MG/5 ML ADULT IV PUSH
1500.0000 mg | Freq: Once | INTRAVENOUS | Status: AC
  Administered 2024-03-21: 1500 mg via INTRAVENOUS
  Filled 2024-03-21: qty 15

## 2024-03-21 MED ORDER — LORAZEPAM 1 MG PO TABS
1.0000 mg | ORAL_TABLET | Freq: Once | ORAL | Status: AC
  Administered 2024-03-21: 1 mg via ORAL
  Filled 2024-03-21: qty 2

## 2024-03-21 MED ORDER — LEVETIRACETAM 1000 MG PO TABS: 1000.0000 mg | ORAL_TABLET | Freq: Two times a day (BID) | ORAL | 1 refills | Status: AC
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
## 2024-03-22 LAB — LEVETIRACETAM LEVEL: Levetiracetam Lvl: <2 ug/mL — ABNORMAL LOW (ref 10.0–40.0)
